# Patient Record
Sex: Female | Born: 1965 | Race: Black or African American | Hispanic: No | Marital: Single | Smoking: Never smoker
Health system: Southern US, Community
[De-identification: ages and names within clinical notes are randomized; demographics above are authoritative.]

---

## 2008-03-10 ENCOUNTER — Emergency Department (HOSPITAL_COMMUNITY): Admission: EM | Admit: 2008-03-10 | Discharge: 2008-03-10 | Payer: Self-pay | Admitting: Emergency Medicine

## 2014-12-04 ENCOUNTER — Emergency Department (HOSPITAL_COMMUNITY)
Admission: EM | Admit: 2014-12-04 | Discharge: 2014-12-04 | Disposition: A | Discharge status: Home / Self Care | Payer: Self-pay | Attending: Emergency Medicine | Admitting: Emergency Medicine

## 2014-12-04 ENCOUNTER — Encounter (HOSPITAL_COMMUNITY): Payer: Self-pay | Admitting: *Deleted

## 2014-12-04 DIAGNOSIS — K0889 Other specified disorders of teeth and supporting structures: Secondary | ICD-10-CM

## 2014-12-04 DIAGNOSIS — K029 Dental caries, unspecified: Secondary | ICD-10-CM | POA: Insufficient documentation

## 2014-12-04 DIAGNOSIS — K088 Other specified disorders of teeth and supporting structures: Secondary | ICD-10-CM | POA: Insufficient documentation

## 2014-12-04 DIAGNOSIS — K002 Abnormalities of size and form of teeth: Secondary | ICD-10-CM | POA: Insufficient documentation

## 2014-12-04 DIAGNOSIS — I10 Essential (primary) hypertension: Secondary | ICD-10-CM | POA: Insufficient documentation

## 2014-12-04 MED ORDER — HYDROCODONE-ACETAMINOPHEN 5-325 MG PO TABS: ORAL_TABLET | ORAL | Status: AC

## 2014-12-04 MED ORDER — PENICILLIN V POTASSIUM 500 MG PO TABS: 500.0000 mg | ORAL_TABLET | Freq: Three times a day (TID) | ORAL | Status: AC

## 2014-12-04 MED ORDER — NAPROXEN 500 MG PO TABS: 500.0000 mg | ORAL_TABLET | Freq: Two times a day (BID) | ORAL | Status: AC

## 2014-12-04 MED ORDER — HYDROCODONE-ACETAMINOPHEN 5-325 MG PO TABS
1.0000 | ORAL_TABLET | Freq: Once | ORAL | Status: AC
  Administered 2014-12-04: 1 via ORAL
  Filled 2014-12-04: qty 1

## 2014-12-04 NOTE — ED Notes (Signed)
The pt has had a toothache for 7 days

## 2014-12-04 NOTE — Discharge Instructions (Signed)
Please read and follow all provided instructions.  Your diagnoses today include:  1. Pain, dental     The exam and treatment you received today has been provided on an emergency basis only. This is not a substitute for complete medical or dental care.  Tests performed today include:  Vital signs. See below for your results today.   Medications prescribed:   Vicodin (hydrocodone/acetaminophen) - narcotic pain medication  DO NOT drive or perform any activities that require you to be awake and alert because this medicine can make you drowsy. BE VERY CAREFUL not to take multiple medicines containing Tylenol (also called acetaminophen). Doing so can lead to an overdose which can damage your liver and cause liver failure and possibly death.   Penicillin - antibiotic  You have been prescribed an antibiotic medicine: take the entire course of medicine even if you are feeling better. Stopping early can cause the antibiotic not to work.   Naproxen - anti-inflammatory pain medication  Do not exceed 500mg  naproxen every 12 hours, take with food  You have been prescribed an anti-inflammatory medication or NSAID. Take with food. Take smallest effective dose for the shortest duration needed for your pain. Stop taking if you experience stomach pain or vomiting.   Take any prescribed medications only as directed.  Home care instructions:  Follow any educational materials contained in this packet.  Follow-up instructions: Please follow-up with your dentist for further evaluation of your symptoms.   Dental Assistance: See below for dental referrals  Return instructions:   Please return to the Emergency Department if you experience worsening symptoms.  Please return if you develop a fever, you develop more swelling in your face or neck, you have trouble breathing or swallowing food.  Please return if you have any other emergent concerns.  Additional Information:  Your vital signs today  were: BP 198/101 mmHg   Pulse 75   Temp(Src) 98.3 F (36.8 C) (Oral)   Resp 16   Ht 5\' 5"  (1.651 m)   Wt 200 lb (90.719 kg)   BMI 33.28 kg/m2   SpO2 98% If your blood pressure (BP) was elevated above 135/85 this visit, please have this repeated by your doctor within one month. -------------- Dental Care: Organization         Address  Phone  Notes  [facility] [address], [place] [phone] Accepts children up to age 48 who are enrolled in [place] or [place] Health Choice; pregnant women with a Medicaid card; and children who have applied for Medicaid or [place] Health Choice, but were declined, whose parents can pay a reduced fee at time of service.  [facility] [place]  [address], [place] [phone] Accepts children up to age 48 who are enrolled in [place] or [place] Health Choice; pregnant women with a Medicaid card; and children who have applied for Medicaid or  Health Choice, but were declined, whose parents can pay a reduced fee at time of service.  [facility]  [address], [place] [phone] Patients are seen by appointment only. Walk-ins are not accepted. [facility] will see patients 918 years of age and older. Monday - Tuesday (8am-5pm) Most Wednesdays (8:30-5pm) $30 per visit, cash only  [facility]  [address], [facility] [phone] Patients are seen by appointment only. Walk-ins are not accepted. [facility]  will see patients 48 years of age and older. One Wednesday Evening (Monthly: Volunteer Based).  $30 per visit, cash only  [facility]  [phone] for adults; Children under age 174, call Graduate Pediatric Dentistry at [phone]. Children aged 714-14, please call [phone] to request a pediatric application.  Dental services are provided in all areas of  dental care including fillings, crowns and bridges, complete and partial dentures, implants, gum treatment, root canals, and extractions. Preventive care is also provided. Treatment is provided to both adults and children. Patients are selected via a lottery and there is often a waiting list.   [facility] [address], [place]  [phone] [email or web address]   [facility] [address], [place], [place] [phone], Ext. [phone] Second and Fourth Thursday of each month, opens at 6:30 AM; Clinic ends at 9 AM.  Patients are seen on a first-come first-served basis, and a limited number are seen during each clinic.   [facility]  [address], [place], [place] [phone]   Eligibility Requirements You must have lived in [place], [place], or [place] for at least the last three months.   You cannot be eligible for state or federal sponsored [place]healthcare insurance, including [employer], [place], or [employer]icare.   You generally cannot be eligible for healthcare insurance through your employer.    How to apply: Eligibility screenings are held every Tuesday and Wednesday afternoon from 1:00 pm until 4:00 pm. You do not need an appointment for the interview!  [facility] [address], [place], [place] [phone]   [facility]  [phone]   [facility]  [phone]   [facility]  [phone]

## 2014-12-04 NOTE — ED Notes (Signed)
Declined W/C at D/C and was escorted to lobby by RN. 

## 2014-12-04 NOTE — ED Provider Notes (Signed)
CSN: [id number]     Arrival date & time 12/04/14  1500 History  This chart was scribed for non-physician practitioner, Renne Crigler, PA-C working with Samuel Jester, DO by Luisa Dago, ED scribe. This patient was seen in room TR09C/TR09C and the patient's care was started at 3:11 PM.     Chief Complaint  Patient presents with  . Dental Pain   The history is provided by the patient and medical records. No language interpreter was used.   HPI Comments: Lauren Gutierrez is a 49 y.o. female with PMhx of multiple dental surgeries presents to the Emergency Department complaining of gradual onset gradually worsening right lower sided dental pain that started 7 days ago. She is complaining of associated right jaw swelling. Pt states that she is in the process of getting a dental appointment set up. Reports taking Tylenol with minimal relief, with last dose 2 hours ago. She denies any fever, neck pain, sore throat, visual disturbance, CP, cough, SOB, abdominal pain, nausea, emesis, diarrhea, urinary symptoms, back pain, HA, weakness, numbness and rash as associated symptoms.    History reviewed. No pertinent past medical history. History reviewed. No pertinent past surgical history. No family history on file. History  Substance Use Topics  . Smoking status: Never Smoker   . Smokeless tobacco: Not on file  . Alcohol Use: Yes   OB History    No data available     Review of Systems  Constitutional: Negative for fever.  HENT: Positive for dental problem and facial swelling. Negative for ear pain, sore throat and trouble swallowing.   Respiratory: Negative for shortness of breath and stridor.   Musculoskeletal: Negative for neck pain and neck stiffness.  Skin: Negative for color change.  Neurological: Negative for headaches.   Allergies  Review of patient's allergies indicates not on file.  Home Medications   Prior to Admission medications   Not on File   BP 198/101 mmHg  Pulse 75   Temp(Src) 98.3 F (36.8 C) (Oral)  Resp 16  Ht  (1.651 m)  Wt 200 lb (90.719 kg)  BMI 33.28 kg/m2  SpO2 98%  Physical Exam  Constitutional: She is oriented to person, place, and time. She appears well-developed and well-nourished.  HENT:  Head: Normocephalic and atraumatic.  Right Ear: Tympanic membrane, external ear and ear canal normal.  Left Ear: Tympanic membrane, external ear and ear canal normal.  Nose: Nose normal.  Mouth/Throat: Uvula is midline, oropharynx is clear and moist and mucous membranes are normal. No trismus in the jaw. Abnormal dentition. Dental caries present. No dental abscesses or uvula swelling. No tonsillar abscesses.  Patient with R mandibular tooth pain and tenderness to palpation in area of 1st molar. 2nd molar is broken to gumline. Mild swelling or erythema noted on exam. No palpable abscess.  Eyes: Conjunctivae are normal.  Neck: Normal range of motion. Neck supple.  No neck swelling or Ludwig's angina  Cardiovascular: Normal rate.   Pulmonary/Chest: Effort normal.  Abdominal: She exhibits no distension.  Lymphadenopathy:    She has no cervical adenopathy.  Neurological: She is alert and oriented to person, place, and time.  Skin: Skin is warm and dry.  Psychiatric: She has a normal mood and affect.  Nursing note and vitals reviewed.   ED Course  Procedures (including critical care time)  DIAGNOSTIC STUDIES: Oxygen Saturation is 98% on RA, normal by my interpretation.    COORDINATION OF CARE: 3:15 PM- Will d/c pt home with dental resources.  Pt advised of plan for treatment and pt agrees.  Medications  HYDROcodone-acetaminophen (NORCO/VICODIN) 5-325 MG per tablet 1 tablet (not administered)   Patient seen and examined. Medications ordered.   Vital signs reviewed and are as follows: BP 198/101 mmHg  Pulse 75  Temp(Src) 98.3 F (36.8 C) (Oral)  Resp 16  Ht 5\' 5"  (1.651 m)  Wt 200 lb (90.719 kg)  BMI 33.28 kg/m2  SpO2 98%  Patient  counseled on use of narcotic pain medications. Counseled not to combine these medications with others containing tylenol. Urged not to drink alcohol, drive, or perform any other activities that requires focus while taking these medications. The patient verbalizes understanding and agrees with the plan.  Patient counseled to take prescribed medications as directed, return with worsening facial or neck swelling, and to follow-up with their dentist as soon as possible.    MDM   Final diagnoses:  Pain, dental  Essential hypertension   Patient with toothache. No fever. Exam unconcerning for Ludwig's angina or other deep tissue infection in neck.   As there is gum swelling, erythema, and facial swelling, will treat with antibiotic and pain medicine. Urged patient to follow-up with dentist.     I personally performed the services described in this documentation, which was scribed in my presence. The recorded information has been reviewed and is accurate.    Renne CriglerJoshua Taiden Raybourn, PA-C 12/04/14 1525  Samuel JesterKathleen McManus, DO 12/05/14 [phone]

## 2016-09-05 DIAGNOSIS — S61052A Open bite of left thumb without damage to nail, initial encounter: Secondary | ICD-10-CM | POA: Insufficient documentation

## 2016-09-05 DIAGNOSIS — Y9301 Activity, walking, marching and hiking: Secondary | ICD-10-CM | POA: Insufficient documentation

## 2016-09-05 DIAGNOSIS — Z79899 Other long term (current) drug therapy: Secondary | ICD-10-CM | POA: Insufficient documentation

## 2016-09-05 DIAGNOSIS — Z23 Encounter for immunization: Secondary | ICD-10-CM | POA: Insufficient documentation

## 2016-09-05 DIAGNOSIS — Y999 Unspecified external cause status: Secondary | ICD-10-CM | POA: Insufficient documentation

## 2016-09-05 DIAGNOSIS — W540XXA Bitten by dog, initial encounter: Secondary | ICD-10-CM | POA: Insufficient documentation

## 2016-09-05 DIAGNOSIS — Y929 Unspecified place or not applicable: Secondary | ICD-10-CM | POA: Insufficient documentation

## 2016-09-06 ENCOUNTER — Emergency Department (HOSPITAL_COMMUNITY): Payer: Self-pay

## 2016-09-06 ENCOUNTER — Emergency Department (HOSPITAL_COMMUNITY)
Admission: EM | Admit: 2016-09-06 | Discharge: 2016-09-06 | Discharge status: Against Medical Advice | Payer: Self-pay | Attending: Emergency Medicine | Admitting: Emergency Medicine

## 2016-09-06 ENCOUNTER — Encounter (HOSPITAL_COMMUNITY): Payer: Self-pay | Admitting: Nurse Practitioner

## 2016-09-06 DIAGNOSIS — W540XXA Bitten by dog, initial encounter: Secondary | ICD-10-CM

## 2016-09-06 MED ORDER — TETANUS-DIPHTH-ACELL PERTUSSIS 5-2.5-18.5 LF-MCG/0.5 IM SUSP
0.5000 mL | Freq: Once | INTRAMUSCULAR | Status: AC
  Administered 2016-09-06: 0.5 mL via INTRAMUSCULAR
  Filled 2016-09-06: qty 0.5

## 2016-09-06 MED ORDER — IBUPROFEN 800 MG PO TABS
800.0000 mg | ORAL_TABLET | Freq: Once | ORAL | Status: AC
  Administered 2016-09-06: 800 mg via ORAL
  Filled 2016-09-06: qty 1

## 2016-09-06 MED ORDER — AMOXICILLIN-POT CLAVULANATE 875-125 MG PO TABS
1.0000 | ORAL_TABLET | Freq: Once | ORAL | Status: AC
  Administered 2016-09-06: 1 via ORAL
  Filled 2016-09-06: qty 1

## 2016-09-06 MED ORDER — AMOXICILLIN-POT CLAVULANATE 875-125 MG PO TABS: 1.0000 | ORAL_TABLET | Freq: Two times a day (BID) | ORAL | 0 refills | Status: AC

## 2016-09-06 NOTE — ED Notes (Signed)
Attempted to contact Pt regarding antibiotic prescription.  VM left w/o violating HIPPA. (09/06/16 @1645 )

## 2016-09-06 NOTE — ED Triage Notes (Signed)
Pt reports being bit by a dog on her left thumb, rabies immunization status of the the dog unknown, pt was advised to come in for further evaluation.

## 2016-09-06 NOTE — Discharge Instructions (Signed)
Take the antibiotic until gone. Soak your thumb daily in epsom salts. Recheck if it gets infected. Make sure animal control quarantines the dogs so you will not have to take the rabies series of vaccinations. Recheck if your thumb gets infected, increased redness, pain, swelling.

## 2016-09-06 NOTE — ED Provider Notes (Signed)
[facility]-EMERGENCY DEPT Provider Note   CSN: [id number] Arrival date & time: 09/05/16  2356 By signing my name below, I, Bridgette HabermannMaria Tan, attest that this documentation has been prepared under the direction and in the presence of Devoria AlbeIva Maysoon Lozada, MD. Electronically Signed: Bridgette HabermannMaria Tan, ED Scribe. 09/06/16. 1:52 AM.  Time seen 01:48 AM  History   Chief Complaint Chief Complaint  Patient presents with  . Animal Bite   HPI Comments: Lauren Gutierrez is a 50 y.o. female with no pertinent PMHx, who presents to the Emergency Department complaining of a dog bite to left thumb during a dog fight ~9:45 pm. Pt reports she was walking her dog when her neighbor's 2 pit bulls where walking around the neighborhood without an owner and came up and attacked her dog. Pt was trying to separate the dogs when one of the dogs bit her, she states it was the gray one; she does not know the rabies immunization status of the dog but Animal Control and [employer] was present. She endorses minimal pain and swelling. Bleeding is controlled with a bandage and neosporin was applied. She denies fever, chills, or any other associated symptoms. Pt is unsure if her Tdap is UTD.   The history is provided by the patient. No language interpreter was used.   PCP none  History reviewed. No pertinent past medical history.  There are no active problems to display for this patient.   History reviewed. No pertinent surgical history.  OB History    No data available       Home Medications    Prior to Admission medications   Medication Sig Start Date End Date Taking? Authorizing Provider  amoxicillin-clavulanate (AUGMENTIN) 875-125 MG tablet Take 1 tablet by mouth every 12 (twelve) hours. 09/06/16   Devoria AlbeIva Kinsie Belford, MD  HYDROcodone-acetaminophen (NORCO/VICODIN) 5-325 MG per tablet Take 1-2 tablets every 6 hours as needed for severe pain 12/04/14   Renne CriglerJoshua Geiple, PA-C  naproxen (NAPROSYN) 500 MG tablet Take 1 tablet (500 mg total) by mouth 2 (two) times  daily. 12/04/14   Renne CriglerJoshua Geiple, PA-C  penicillin v potassium (VEETID) 500 MG tablet Take 1 tablet (500 mg total) by mouth 3 (three) times daily. 12/04/14   Renne CriglerJoshua Geiple, PA-C    Family History History reviewed. No pertinent family history.  Social History Social History  Substance Use Topics  . Smoking status: Never Smoker  . Smokeless tobacco: Not on file  . Alcohol use Yes     Allergies   Patient has no known allergies.   Review of Systems Review of Systems  Constitutional: Negative for chills and fever.  Skin: Positive for wound.  All other systems reviewed and are negative.  Physical Exam Updated Vital Signs BP 195/89 (BP Location: Right Arm)   Pulse 76   Temp 98 F (36.7 C) (Oral)   Resp 20   Ht 5\' 5"  (1.651 m)   Wt 195 lb (88.5 kg)   LMP 09/06/2016 (Exact Date)   SpO2 100%   BMI 32.45 kg/m   Vital signs normal   Physical Exam  Constitutional: She is oriented to person, place, and time. She appears well-developed and well-nourished.  Non-toxic appearance. She does not appear ill. No distress.  HENT:  Head: Normocephalic and atraumatic.  Right Ear: External ear normal.  Left Ear: External ear normal.  Nose: Nose normal. No mucosal edema or rhinorrhea.  Mouth/Throat: No dental abscesses or uvula swelling.  Eyes: Conjunctivae and EOM are normal.  Neck: Normal range of motion  and full passive range of motion without pain. Neck supple.  Cardiovascular: Normal rate.   Pulmonary/Chest: Effort normal. No respiratory distress.  Musculoskeletal: Normal range of motion. She exhibits no edema or tenderness.  Moves all extremities well. 1 cm laceration over the proximal phalanx of left thumb on volar aspect. Puncture wound near the IP joint of the left thumb on the dorsal ulnar aspect.  Neurological: She is alert and oriented to person, place, and time. No cranial nerve deficit.  Skin: Skin is warm and dry. No rash noted. No erythema. No pallor.  Psychiatric: She has  a normal mood and affect. Her behavior is normal. Her mood appears not anxious.  Nursing note and vitals reviewed.        ED Treatments / Results  DIAGNOSTIC STUDIES: Oxygen Saturation is 100% on RA, normal by my interpretation.      Radiology Dg Finger Thumb Left  Result Date: 09/06/2016 CLINICAL DATA:  50 y/o F; attack by dogs with puncture wound to the PIP of the thumb. EXAM: LEFT THUMB 2+V COMPARISON:  None. FINDINGS: There is no evidence of fracture or dislocation. There is no evidence of arthropathy or other focal bone abnormality. No radiopaque foreign body. IMPRESSION: No acute fracture or dislocation. No radiopaque foreign body identified. Electronically Signed   By: Mitzi HansenLance  Furusawa-Stratton M.D.   On: 09/06/2016 02:30    Procedures Procedures (including critical care time)  Medications Ordered in ED Medications  Tdap (BOOSTRIX) injection 0.5 mL (0.5 mLs Intramuscular Given 09/06/16 0207)  ibuprofen (ADVIL,MOTRIN) tablet 800 mg (800 mg Oral Given 09/06/16 0207)  amoxicillin-clavulanate (AUGMENTIN) 875-125 MG per tablet 1 tablet (1 tablet Oral Given 09/06/16 0206)     Initial Impression / Assessment and Plan / ED Course  I have reviewed the triage vital signs and the nursing notes.  Pertinent labs & imaging results that were available during my care of the patient were reviewed by me and considered in my medical decision making (see chart for details).  Clinical Course     COORDINATION OF CARE: 1:52 AM Discussed treatment plan with pt at bedside which includes x-ray and antibiotic Rx and pt agreed to plan. Discussed that her wounds would not be sutured due to increased risk of getting an infection after dog bite. X-ray was of obtained due to concern that one bite was close to the IP joint. Her tetanus status was updated. She knows the owners of the dogs in animal control has been involved. She was not started on the rabies vaccines.   Nurse reports patient left  without getting her prescription.  Final Clinical Impressions(s) / ED Diagnoses   Final diagnoses:  Dog bite, initial encounter    New Prescriptions New Prescriptions   AMOXICILLIN-CLAVULANATE (AUGMENTIN) 875-125 MG TABLET    Take 1 tablet by mouth every 12 (twelve) hours.   Plan discharge  Devoria AlbeIva Xuan Mateus, MD, FACEP   I personally performed the services described in this documentation, which was scribed in my presence. The recorded information has been reviewed and considered.  Devoria AlbeIva Sharlee Rufino, MD, Concha PyoFACEP     Khang Hannum, MD 09/06/16 [phone]

## 2016-09-06 NOTE — ED Notes (Signed)
Bed: WA03 Expected date:  Expected time:  Means of arrival:  Comments: 

## 2016-09-06 NOTE — ED Notes (Signed)
Pt left her antibiotic prescription. Attempted to call pt but no answer. Will attempt again.

## 2017-12-08 IMAGING — CR DG FINGER THUMB 2+V*L*
3 series · 3 of 3 positions shown · non-contrast
Comparison: None.

CLINICAL DATA: 50 y/o F; attack by dogs with puncture wound to the
PIP of the thumb.

EXAM:
LEFT THUMB 2+V

[x finger pa left]
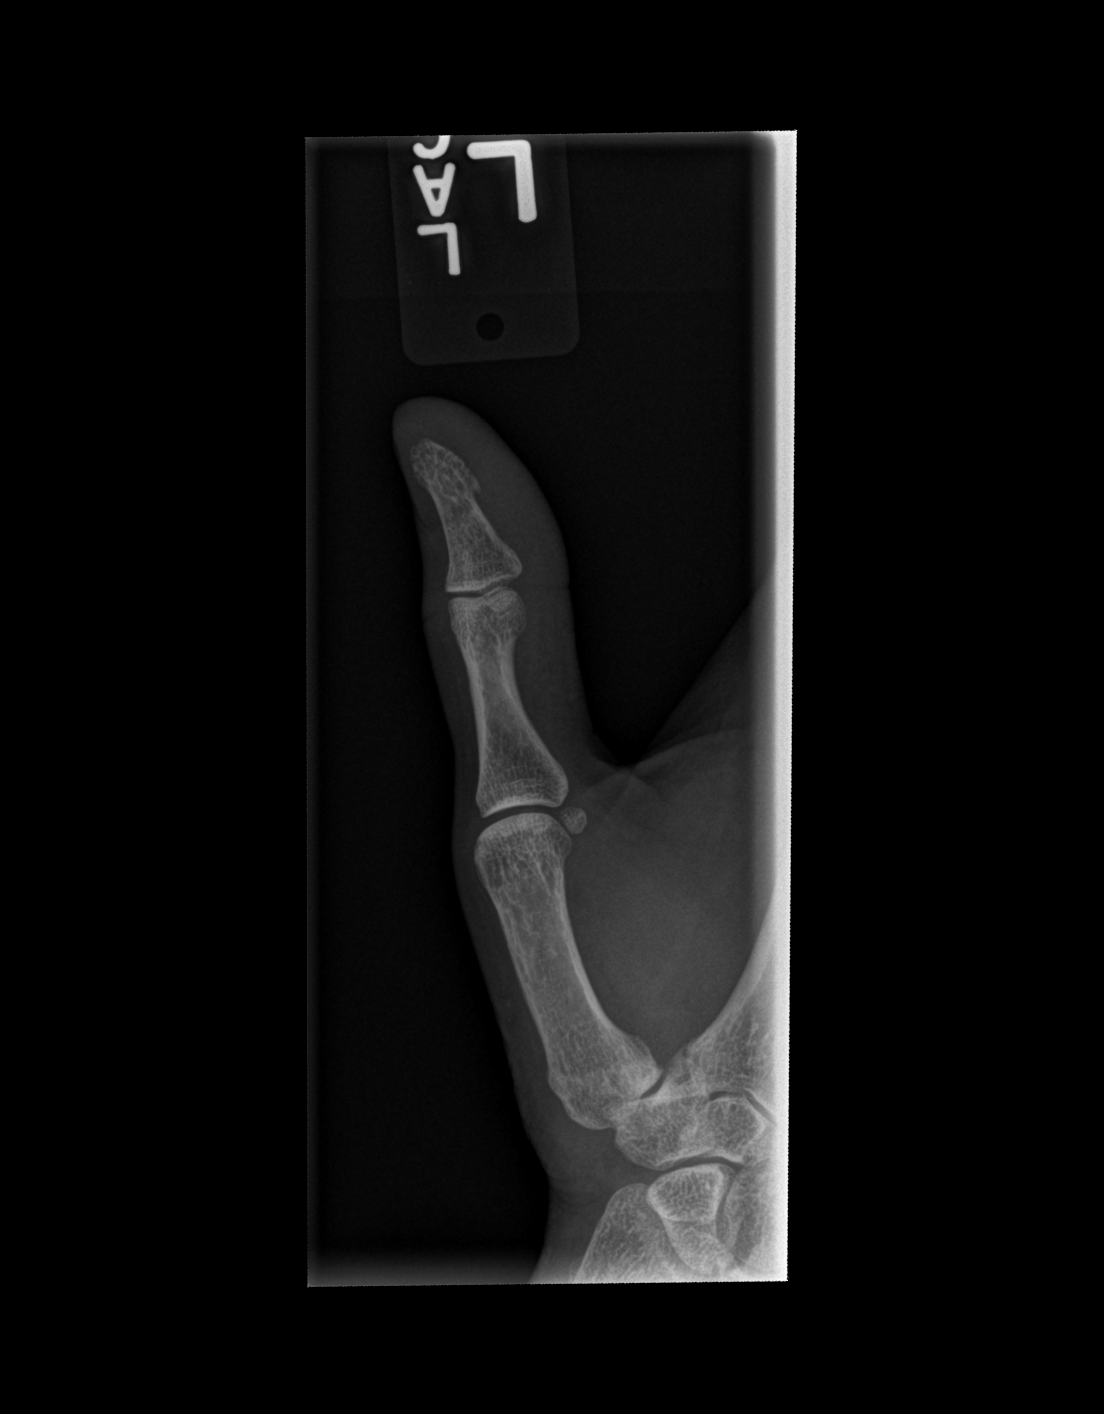

[x finger obl left]
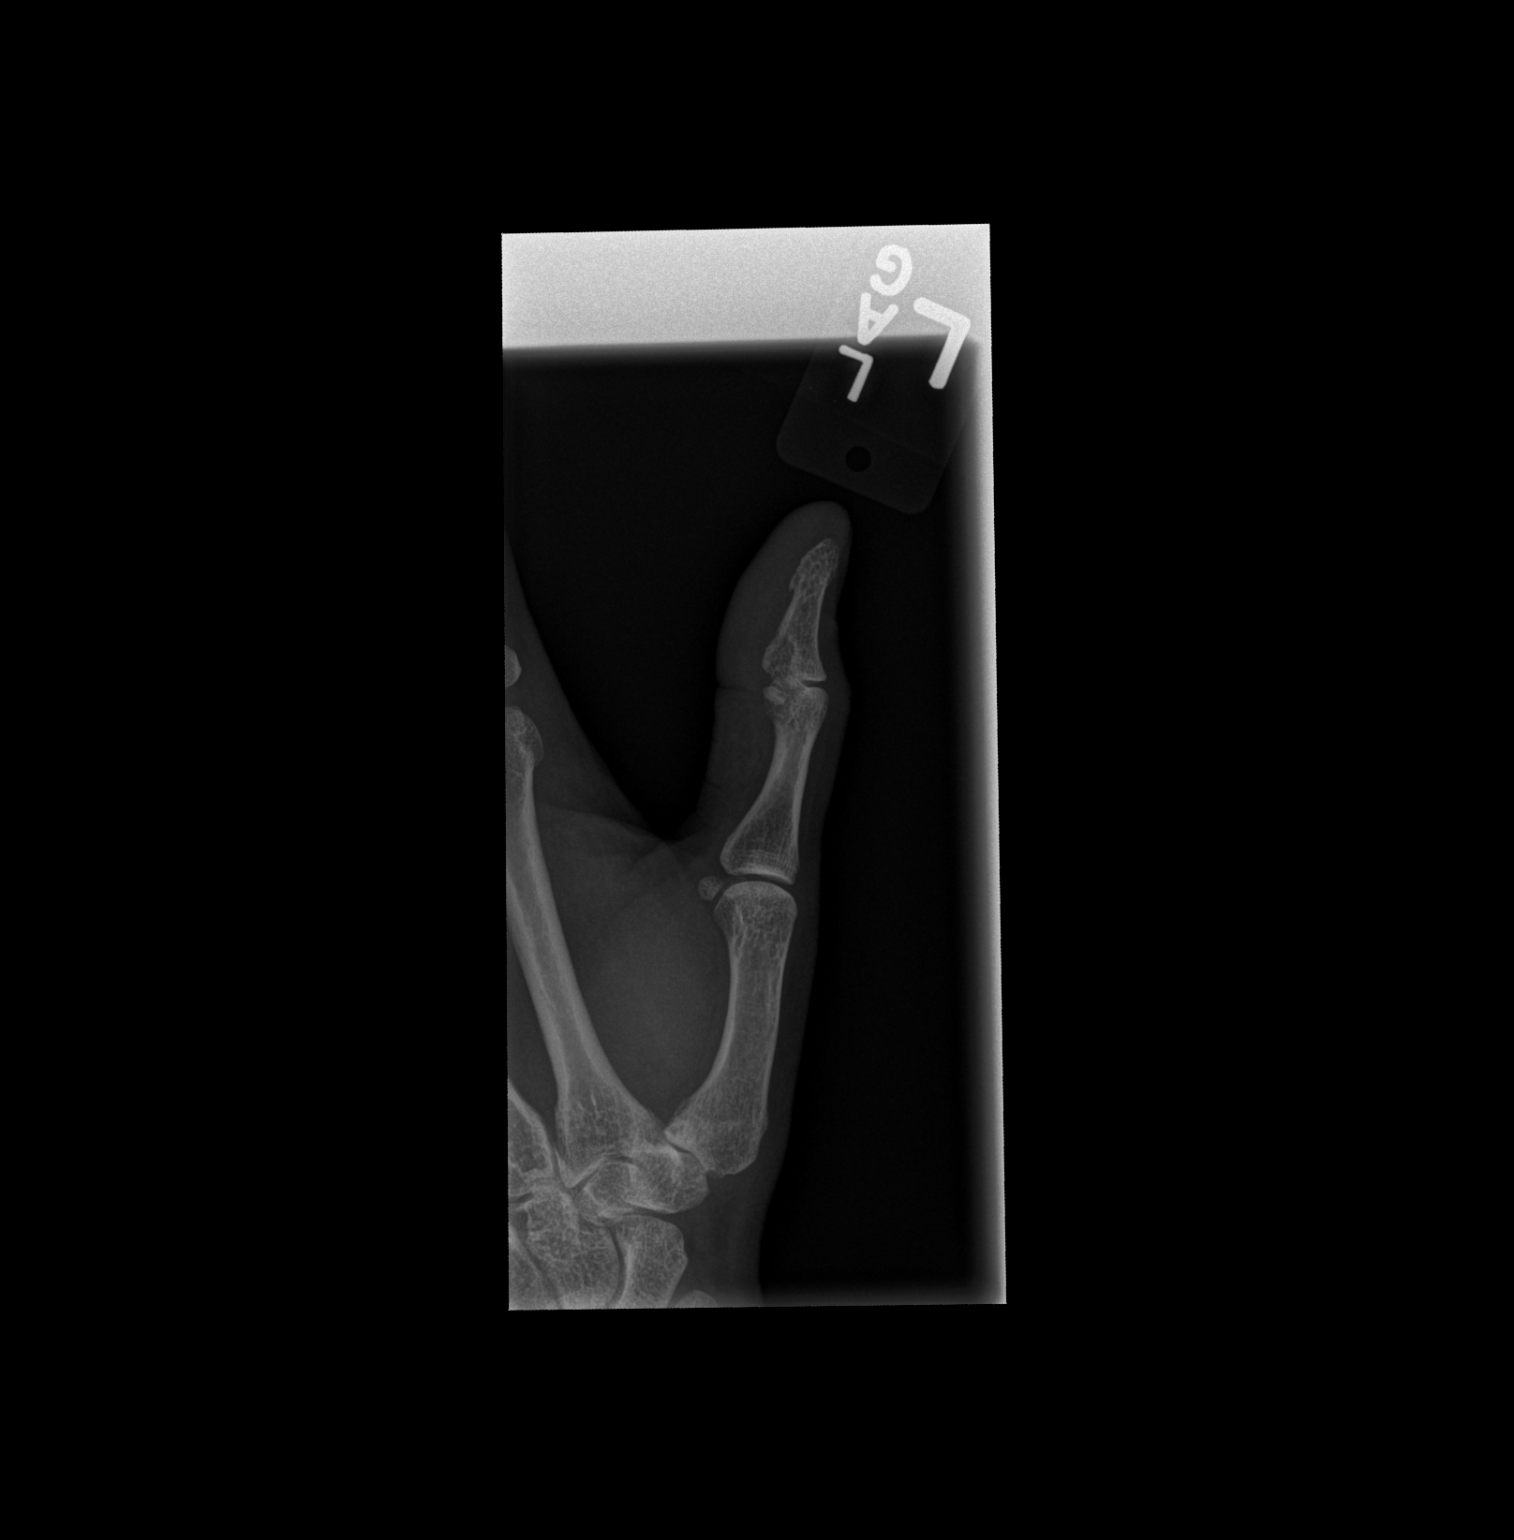

[x finger lat left]
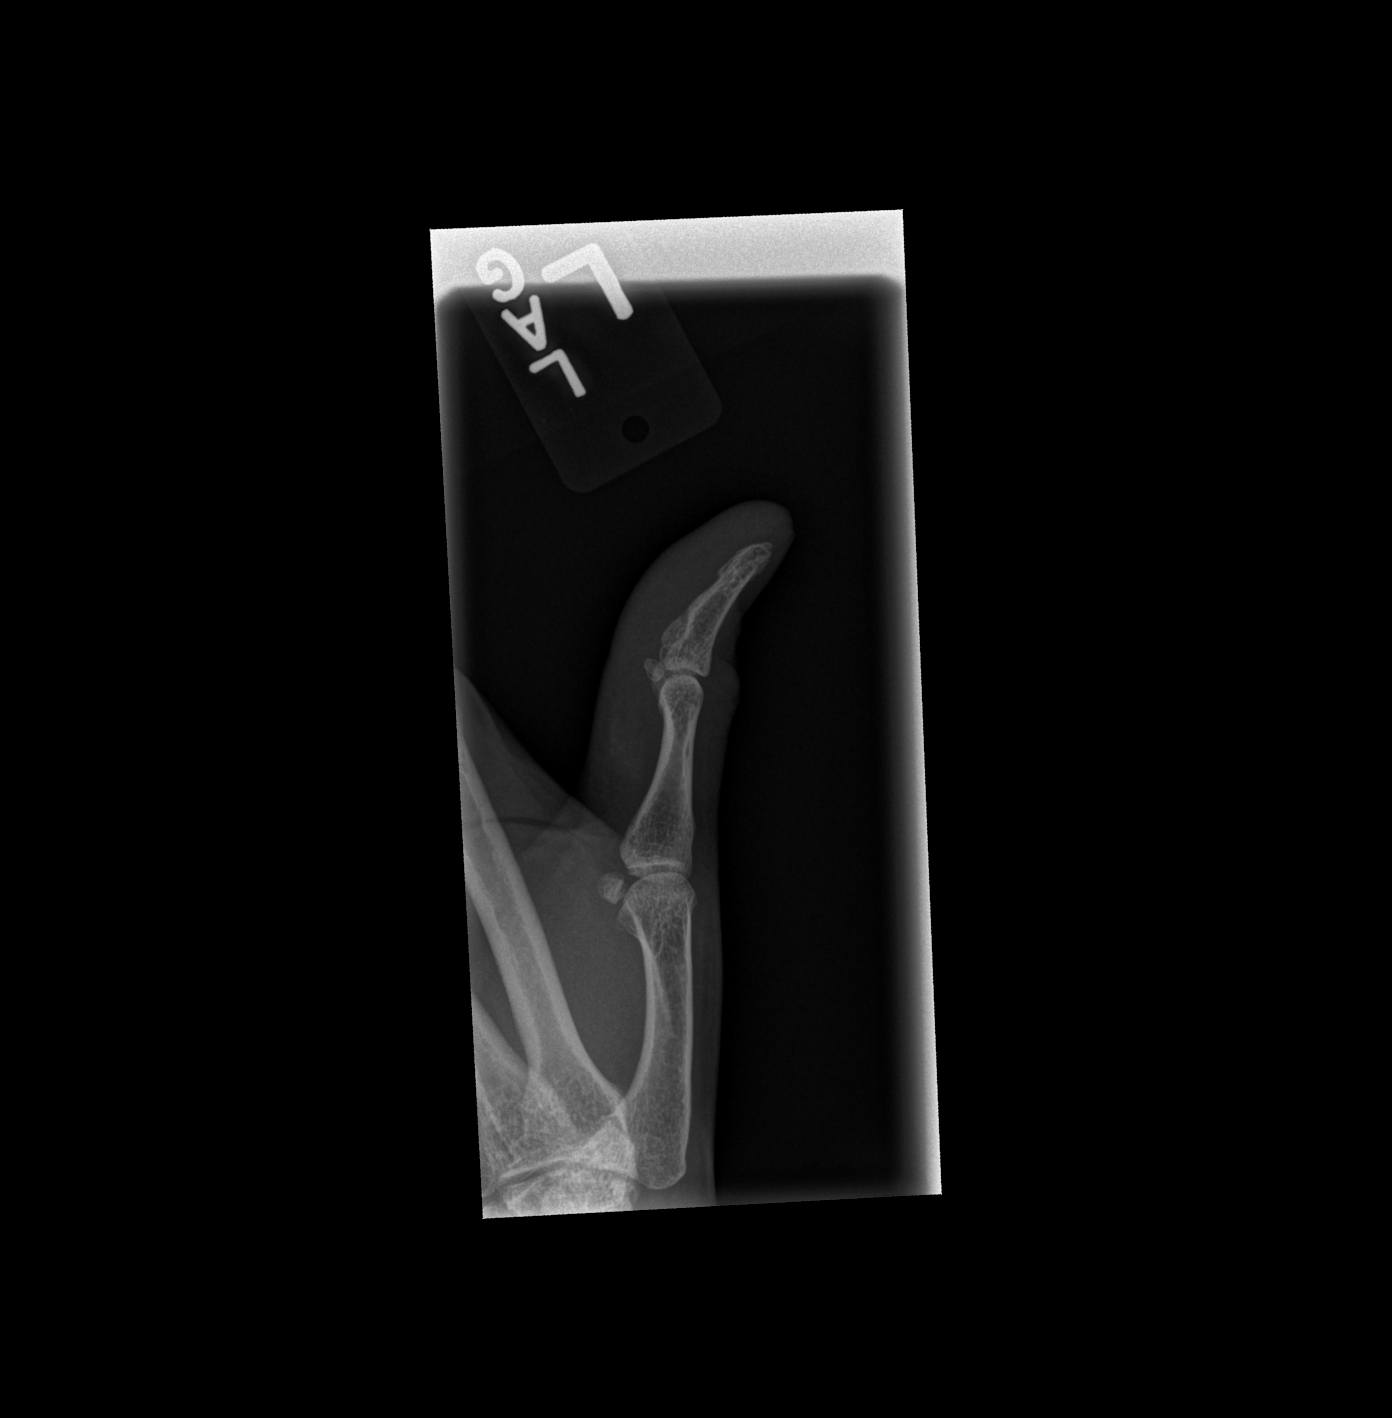

[3 of 3 positions shown; findings below may reference images not displayed]

FINDINGS: There is no evidence of fracture or dislocation. There is no
evidence of arthropathy or other focal bone abnormality. No
radiopaque foreign body.
IMPRESSION: No acute fracture or dislocation. No radiopaque foreign body
identified.

By: Winter Mariah M.D.
